# Patient Record
Sex: Male | Born: 1970 | Race: Black or African American | Hispanic: No | Marital: Married | Smoking: Never smoker
Health system: Southern US, Community
[De-identification: ages and names within clinical notes are randomized; demographics above are authoritative.]

---

## 2005-11-18 ENCOUNTER — Emergency Department: Payer: Self-pay | Admitting: Emergency Medicine

## 2006-08-15 IMAGING — CR RIGHT FOOT COMPLETE - 3+ VIEW
1 series · 3 of 3 positions shown · non-contrast
Comparison: none

REASON FOR EXAM: Injury
COMMENTS:  LMP: (Male)

PROCEDURE:     DXR - DXR FOOT RT COMPLETE W/OBLIQUES  - November 18, 2005  [DATE]
RESULT:          The bones of the foot appear to be adequately mineralized.
There is an Achilles and plantar calcaneal spur.  I see no acute fracture.

[Series 1: view not recorded · 0.17mm/px · 3 of 3 slices shown]
[im 1/3]
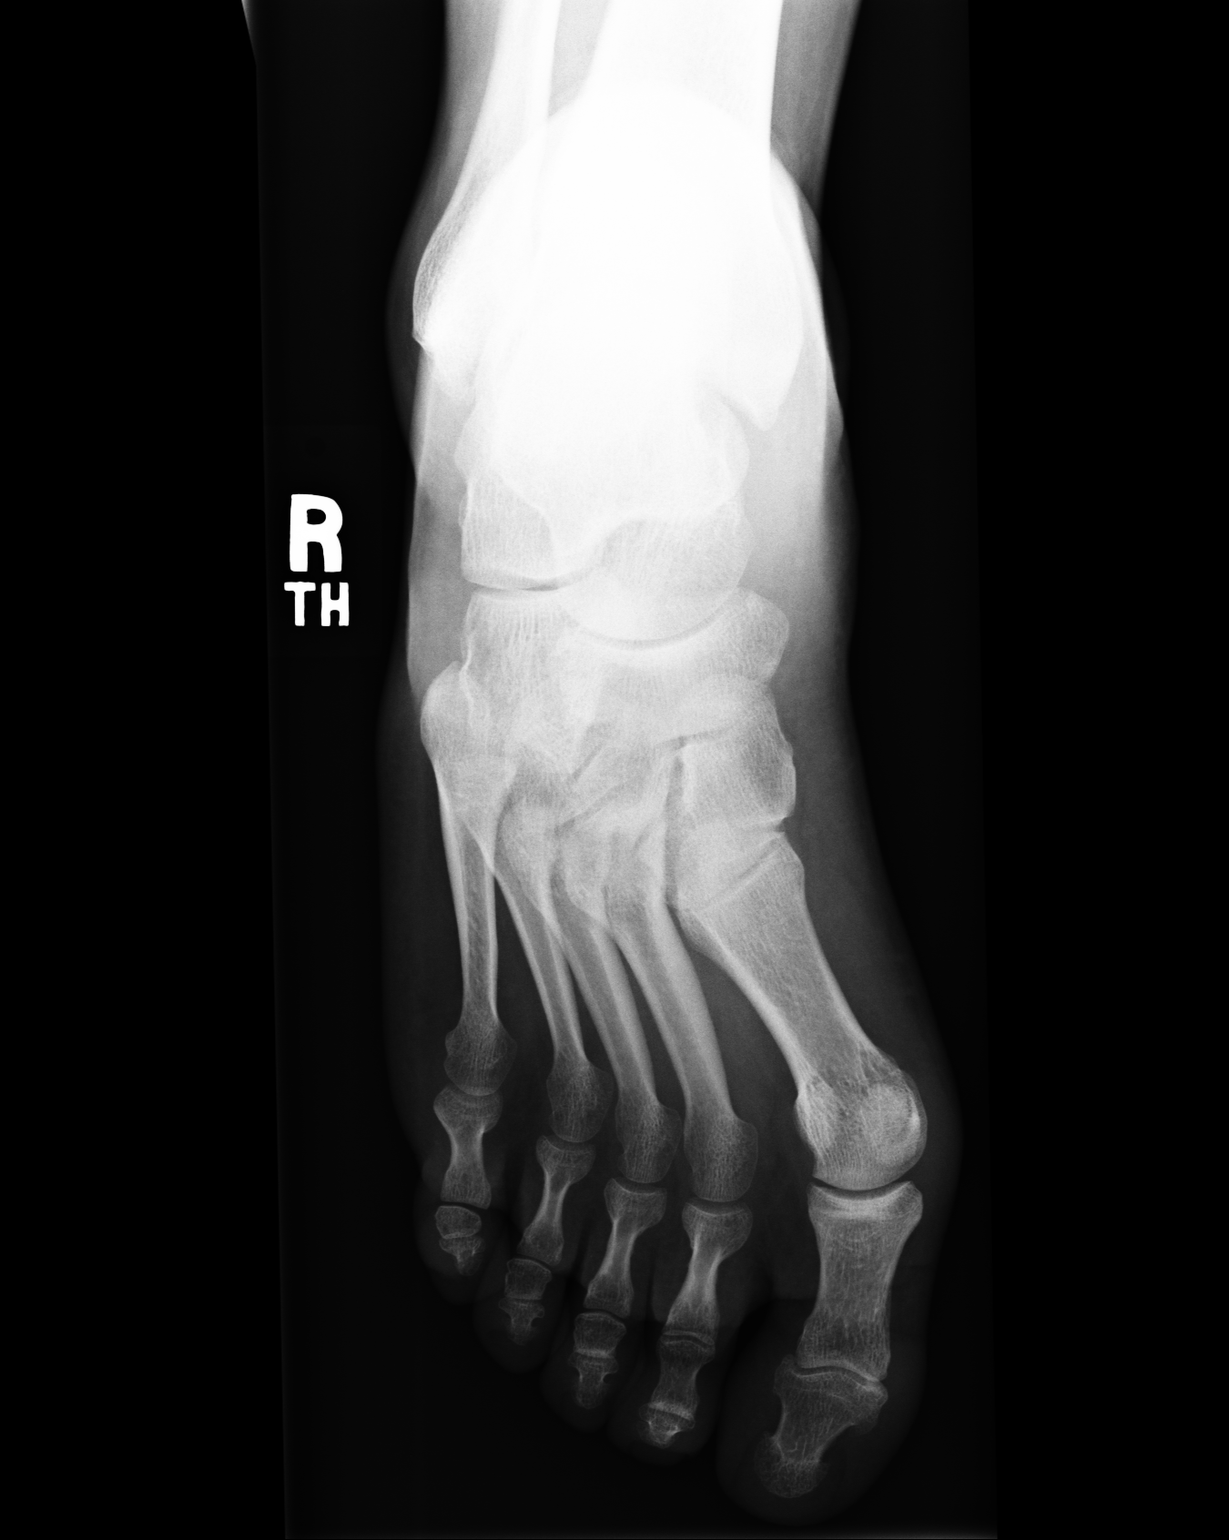
[im 2/3]
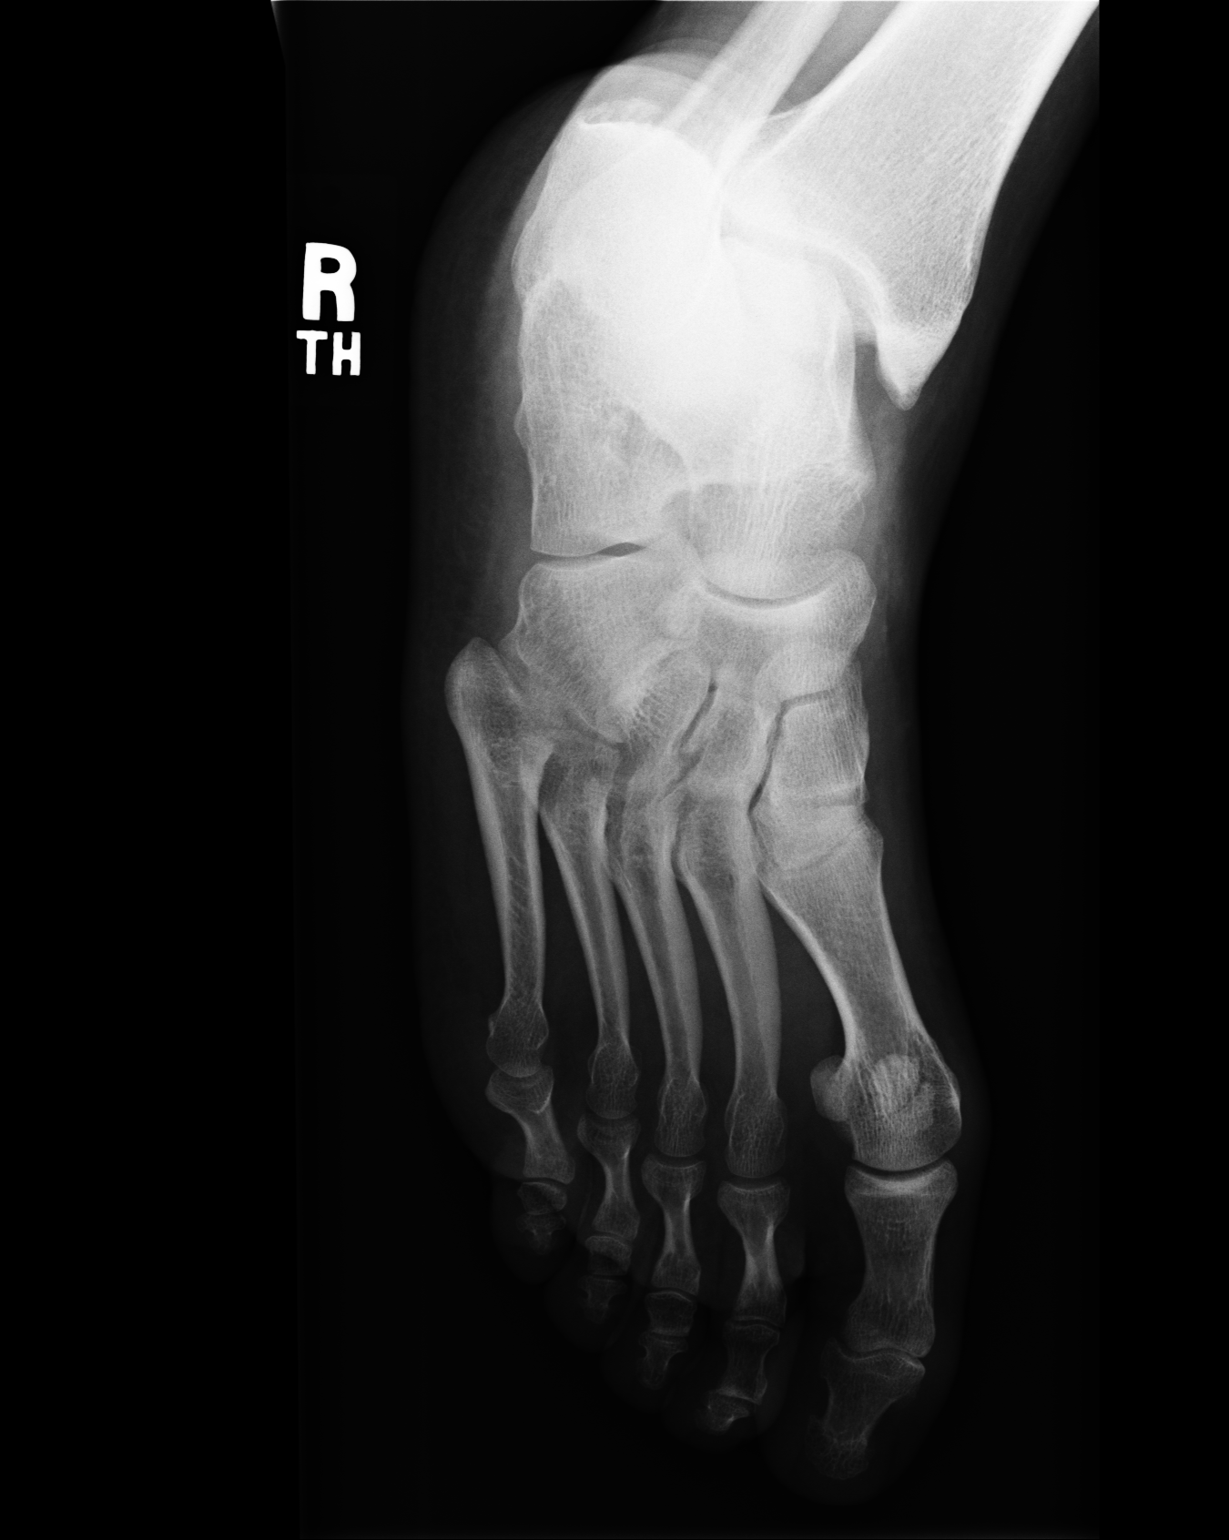
[im 3/3]
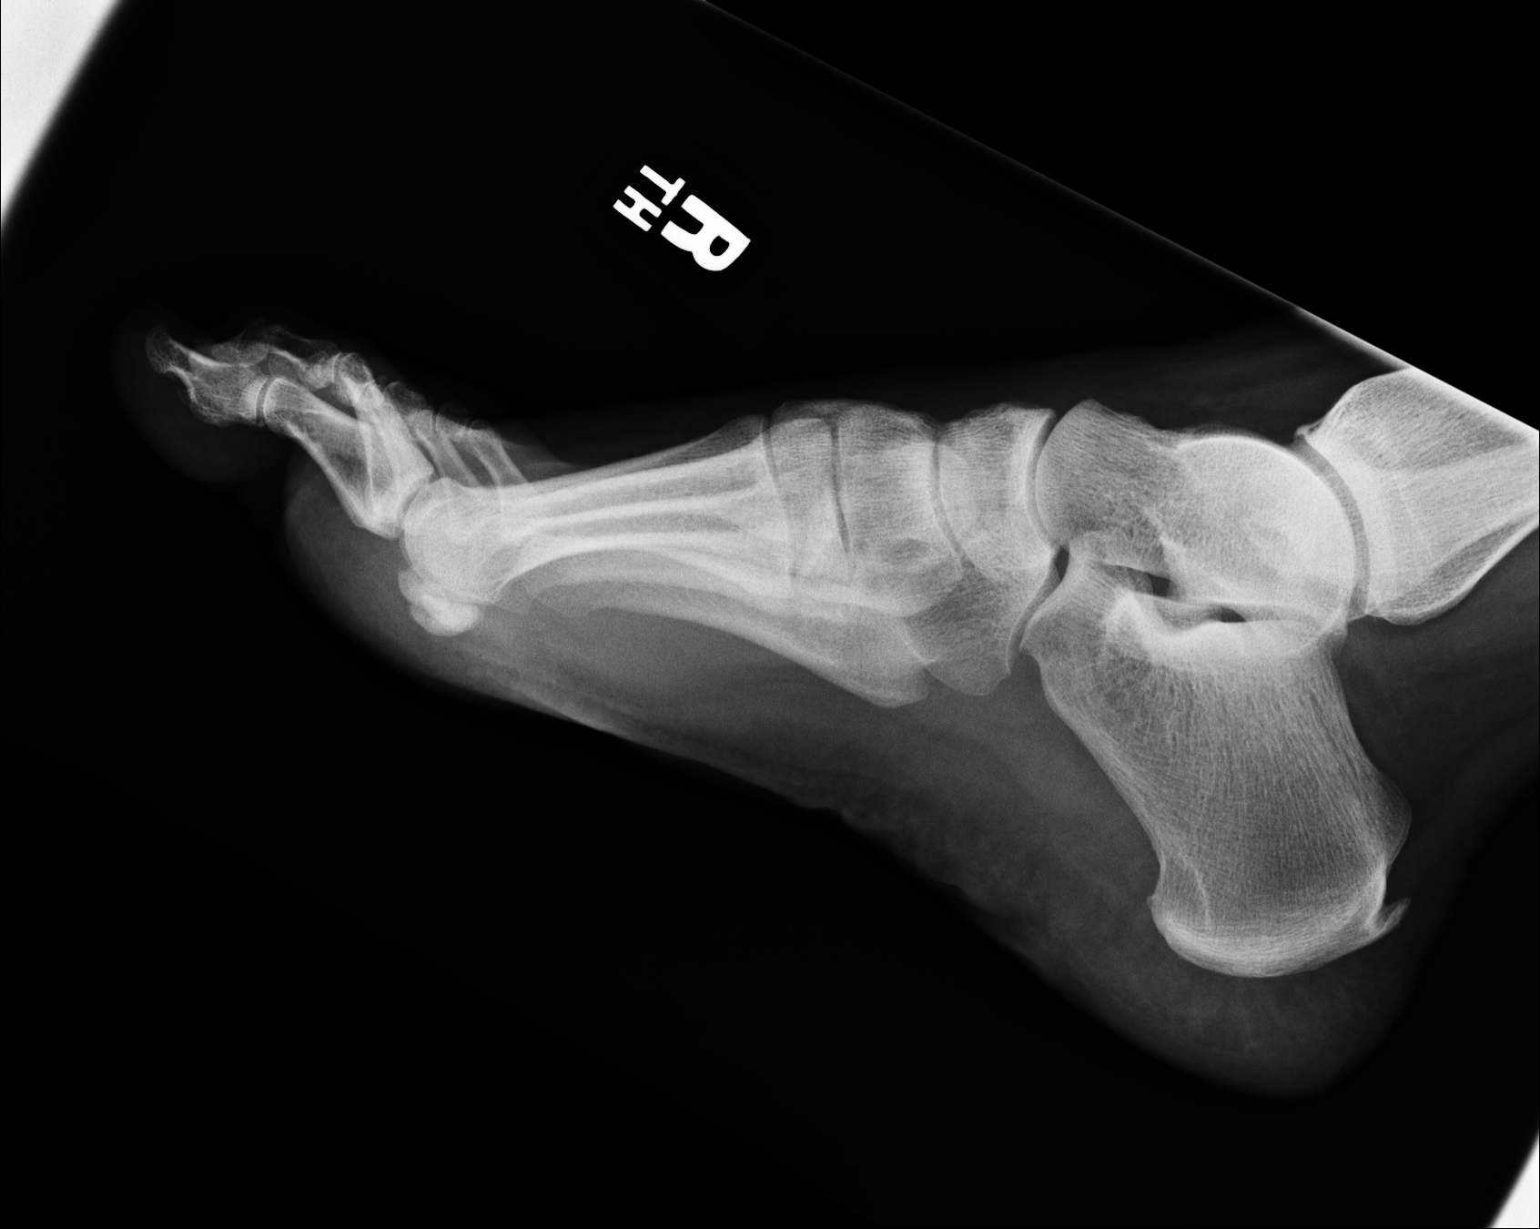

[3 of 3 positions shown; findings below may reference images not displayed]

IMPRESSION: I see no acute bony abnormality of the RIGHT foot.

## 2016-06-04 ENCOUNTER — Ambulatory Visit (INDEPENDENT_AMBULATORY_CARE_PROVIDER_SITE_OTHER): Payer: Managed Care, Other (non HMO) | Admitting: Podiatry

## 2016-06-04 ENCOUNTER — Encounter: Payer: Self-pay | Admitting: Podiatry

## 2016-06-04 ENCOUNTER — Ambulatory Visit (INDEPENDENT_AMBULATORY_CARE_PROVIDER_SITE_OTHER): Payer: Managed Care, Other (non HMO)

## 2016-06-04 VITALS — BP 120/73 | HR 64 | Resp 18

## 2016-06-04 DIAGNOSIS — R52 Pain, unspecified: Secondary | ICD-10-CM

## 2016-06-04 DIAGNOSIS — M779 Enthesopathy, unspecified: Secondary | ICD-10-CM | POA: Diagnosis not present

## 2016-06-04 MED ORDER — MELOXICAM 15 MG PO TABS: 15.0000 mg | ORAL_TABLET | Freq: Every day | ORAL | Status: AC

## 2016-06-04 NOTE — Progress Notes (Signed)
   Subjective:    Patient ID: Vincent Rose, male    DOB: 1971/09/04, 45 y.o.   MRN: [id number]  HPI  45 year old male presents the office today for concerns of pain to the office acid of his right foot which is been ongoing for several weeks was worsened on Friday. He does work on concrete for sending all day. He said no some discomfort after he was doing quite a walking. He has had no recent treatment for this. No swelling or redness. No numbness or tingling. Pain is not waking up at night. No other complaints at this time.  Review of Systems  All other systems reviewed and are negative.      Objective:   Physical Exam General: AAO x3, NAD  Dermatological: Skin is warm, dry and supple bilateral. Nails x 10 are well manicured; remaining integument appears unremarkable at this time. There are no open sores, no preulcerative lesions, no rash or signs of infection present.  Vascular: Dorsalis Pedis artery and Posterior Tibial artery pedal pulses are 2/4 bilateral with immedate capillary fill time. Pedal hair growth present.  There is no pain with calf compression, swelling, warmth, erythema.   Neruologic: Grossly intact via light touch bilateral. Vibratory intact via tuning fork bilateral. Protective threshold with Semmes Wienstein monofilament intact to all pedal sites bilateral. Patellar and Achilles deep tendon reflexes 2+ bilateral.   Musculoskeletal: There is tenderness palpation of the fifth metatarsal base and the right foot just proximal in the insertion of the peroneal tendon. There doesn't be localized edema to this area any erythema or increase in warmth. There is no pain to vibratory sensation. There is no areas of tenderness bilaterally. There doesn't be some mild pain with eversion of the foot. MMT 5/5. Range of motion intact. On gait evaluation does tend to roll his ankle he has a cavus type foot.  Gait: Unassisted, Nonantalgic.        Assessment & Plan:  Right foot pain,  likely insertional peroneal tendinitis -Treatment options discussed including all alternatives, risks, and complications -Etiology of symptoms were discussed -X-rays were obtained and reviewed with the patient. No evidence of acute fracture or stress fracture identified at this time. -Discussed steroid injection to the area of maximal tenderness knee wishes to proceed. Under sterile conditions a mixture of dexamethasone phosphate and local anesthetic was infiltrated to the area of maximal tenderness, complications. Care was taken not to inject directly into the tendon and this was placed around the area. Post injection care was discussed. -Plantar fascial brace dispensed and applied medial to lateral. -Prescribed mobic. Discussed side effects of the medication and directed to stop if any are to occur and call the office.  -Ice to the area -I discussed in shoe gear modifications and orthotics. Discussed CMO and he will let us know if he wants to pursue them.  -Follow-up as scheduled or sooner if any problems arise. In the meantime, encouraged to call the office with any questions, concerns, change in symptoms.   Ovid CurdMatthew Wagoner, DPM

## 2016-06-25 ENCOUNTER — Ambulatory Visit: Payer: Managed Care, Other (non HMO) | Admitting: Podiatry

## 2016-07-02 ENCOUNTER — Encounter: Payer: Self-pay | Admitting: Podiatry

## 2016-07-02 ENCOUNTER — Ambulatory Visit (INDEPENDENT_AMBULATORY_CARE_PROVIDER_SITE_OTHER): Payer: Managed Care, Other (non HMO) | Admitting: Podiatry

## 2016-07-02 DIAGNOSIS — M779 Enthesopathy, unspecified: Secondary | ICD-10-CM | POA: Diagnosis not present

## 2016-07-02 NOTE — Patient Instructions (Signed)
Peroneal Tendinitis With Rehab Tendonitis is inflammation of a tendon. Inflammation of the tendons on the back of the outer ankle (peroneal tendons) is known as peroneal tendonitis. The peroneal tendons are responsible for connecting the muscles that allow you to stand on your tiptoes to the bones of the ankle. For this reason, peroneal tendonitis often causes pain when trying to complete such motions. Peroneal tendonitis often involves a tear (strain) of the peroneal tendons. Strains are classified into three categories. Grade 1 strains cause pain, but the tendon is not lengthened. Grade 2 strains include a lengthened ligament, due to the ligament being stretched or partially ruptured. With grade 2 strains there is still function, although function may be decreased. Grade 3 strains involve a complete tear of the tendon or muscle, and function is usually impaired. SYMPTOMS   Pain, tenderness, swelling, warmth, or redness over the back of the outer side of the ankle, the outer part of the mid-foot, or the bottom of the arch.  Pain that gets worse with ankle motion (especially when pushing off or pushing down with the front of the foot), or when standing on the ball of the foot or pushing the foot outward.  Crackling sound (crepitation) when the tendon is moved or touched. CAUSES  Peroneal tendinitis occurs when injury to the peroneal tendons causes the body to respond with inflammation. Common causes of injury include:  An overuse injury, in which the groove behind the outer ankle (where the tendon is located) causes wear on the tendon.  A sudden stress placed on the tendon, such as from an increase in the intensity, frequency, or duration of training.  Direct hit (trauma) to the tendon.  Return to activity too soon after a previous ankle injury. RISK INCREASES WITH:  Sports that require sudden, repetitive pushing off of the foot, such as jumping or quick starts.  Kicking and running sports,  especially running down hills or long distances.  Poor strength and flexibility.  Previous injury to the foot, ankle, or leg. PREVENTION  Warm up and stretch properly before activity.  Allow for adequate recovery between workouts.  Maintain physical fitness:  Strength, flexibility, and endurance.  Cardiovascular fitness.  Complete rehabilitation after previous injury. PROGNOSIS  If treated properly, peroneal tendonitis usually heals within 6 weeks.  RELATED COMPLICATIONS  Longer healing time, if not properly treated or if not given enough time to heal.  Recurring symptoms if activity is resumed too soon, with overuse, or when using poor technique.  If untreated, tendinitis may result in tendon rupture, requiring surgery. TREATMENT  Treatment first involves the use of ice and medicine to reduce pain and inflammation. The use of strengthening and stretching exercises may help reduce pain with activity. These exercises may be performed at unsuccessful, surgery to remove the inflamed tendon lining (sheath) may be advised.  MEDICATION   If pain medicine is needed, nonsteroidal anti-inflammatory medicines (aspirin and ibuprofen), or other minor pain relievers (acetaminophen), are often advised.  Do not take pain medicine for 7 days before surgery.  Prescription pain relievers may be given, if your caregiver thinks they are needed. Use only as directed and only as much as you need. HEAT AND COLD  Cold treatment (icing) should be applied for 10 to 15 minutes every 2 to 3 hours for inflammation and pain, and immediately after activity that aggravates your symptoms. Use ice packs or an ice massage.  Heat treatment may be used before performing stretching and strengthening activities prescribed by your   caregiver, physical therapist, or athletic trainer. Use a heat pack or a warm water soak. SEEK MEDICAL CARE IF:  Symptoms get worse or do not improve in 2 to 4 weeks, despite  treatment.  New, unexplained symptoms develop. (Drugs used in treatment may produce side effects.) EXERCISES RANGE OF MOTION (ROM) AND STRETCHING EXERCISES - Peroneal Tendinitis These exercises may help you when beginning to rehabilitate your injury. Your symptoms may resolve with or without further involvement from your physician, physical therapist or athletic trainer. While completing these exercises, remember:   Restoring tissue flexibility helps normal motion to return to the joints. This allows healthier, less painful movement and activity.  An effective stretch should be held for at least 30 seconds.  A stretch should never be painful. You should only feel a gentle lengthening or release in the stretched tissue. RANGE OF MOTION - Ankle Eversion  Sit with your right / left ankle crossed over your opposite knee.  Grip your foot with your opposite hand, placing your thumb on the top of your foot and your fingers across the bottom of your foot.  Gently push your foot downward with a slight rotation, so your littlest toes rise slightly toward the ceiling.  You should feel a gentle stretch on the inside of your ankle. Hold the stretch for __________ seconds. Repeat __________ times. Complete this exercise __________ times per day.  RANGE OF MOTION - Ankle Inversion  Sit with your right / left ankle crossed over your opposite knee.  Grip your foot with your opposite hand, placing your thumb on the bottom of your foot and your fingers across the top of your foot.  Gently pull your foot so the smallest toe comes toward you and your thumb pushes the inside of the ball of your foot away from you.  You should feel a gentle stretch on the outside of your ankle. Hold the stretch for __________ seconds. Repeat __________ times. Complete this exercise __________ times per day.  RANGE OF MOTION - Ankle Plantar Flexion  Sit with your right / left leg crossed over your opposite knee.  Use  your opposite hand to pull the top of your foot and toes toward you.  You should feel a gentle stretch on the top of your foot and ankle. Hold this position for __________ seconds. Repeat __________ times. Complete __________ times per day.  STRETCH - Gastroc, Standing  Place your hands on a wall.  Extend your right / left leg behind you, keeping the front knee somewhat bent.  Slightly point your toes inward on your back foot.  Keeping your right / left heel on the floor and your knee straight, shift your weight toward the wall, not allowing your back to arch.  You should feel a gentle stretch in the calf. Hold this position for __________ seconds. Repeat __________ times. Complete this stretch __________ times per day. STRETCH - Soleus, Standing  Place your hands on a wall.  Extend your right / left leg behind you, keeping the other knee somewhat bent.  Slightly point your toes inward on your back foot.  Keep your heel on the floor, bend your back knee, and slightly shift your weight over the back leg so that you feel a gentle stretch deep in your back calf.  Hold this position for __________ seconds. Repeat __________ times. Complete this stretch __________ times per day. STRETCH - Gastrocsoleus, Standing Note: This exercise can place a lot of stress on your foot and ankle. Please   complete this exercise only if specifically instructed by your caregiver.   Place the ball of your right / left foot on a step, keeping your other foot firmly on the same step.  Hold on to the wall or a rail for balance.  Slowly lift your other foot, allowing your body weight to press your heel down over the edge of the step.  You should feel a stretch in your right / left calf.  Hold this position for __________ seconds.  Repeat this exercise with a slight bend in your knee. Repeat __________ times. Complete this stretch __________ times per day.  STRENGTHENING EXERCISES - Peroneal Tendinitis   These exercises may help you when beginning to rehabilitate your injury. They may resolve your symptoms with or without further involvement from your physician, physical therapist or athletic trainer. While completing these exercises, remember:   Muscles can gain both the endurance and the strength needed for everyday activities through controlled exercises.  Complete these exercises as instructed by your physician, physical therapist or athletic trainer. Increase the resistance and repetitions only as guided by your caregiver. STRENGTH - Dorsiflexors  Secure a rubber exercise band or tubing to a fixed object (table, pole) and loop the other end around your right / left foot.  Sit on the floor facing the fixed object. The band should be slightly tense when your foot is relaxed.  Slowly draw your foot back toward you, using your ankle and toes.  Hold this position for __________ seconds. Slowly release the tension in the band and return your foot to the starting position. Repeat __________ times. Complete this exercise __________ times per day.  STRENGTH - Towel Curls  Sit in a chair, on a non-carpeted surface.  Place your foot on a towel, keeping your heel on the floor.  Pull the towel toward your heel only by curling your toes. Keep your heel on the floor.  If instructed by your physician, physical therapist or athletic trainer, add weight to the end of the towel. Repeat __________ times. Complete this exercise __________ times per day. STRENGTH - Ankle Eversion   Secure one end of a rubber exercise band or tubing to a fixed object (table, pole). Loop the other end around your foot, just before your toes.  Place your fists between your knees. This will focus your strengthening at your ankle.  Drawing the band across your opposite foot, away from the pole, slowly, pull your little toe out and up. Make sure the band is positioned to resist the entire motion.  Hold this position for  __________ seconds.  Have your muscles resist the band, as it slowly pulls your foot back to the starting position. Repeat __________ times. Complete this exercise __________ times per day.    This information is not intended to replace advice given to you by your health care provider. Make sure you discuss any questions you have with your health care provider.   Document Released: 11/04/2005 Document Revised: 03/21/2015 Document Reviewed: 02/16/2009 [employer] Interactive Patient Education 2016 [employer]  

## 2016-07-02 NOTE — Progress Notes (Signed)
Subjective: 45 year old male presents the office today for follow-up evaluation of tendinitis. He states that overall he is much better. He states the injection the braces helped however the meloxicam did not do much. He has been stretching and icing as well. No new concerns today. Denies any systemic complaints such as fevers, chills, nausea, vomiting. No acute changes since last appointment, and no other complaints at this time.   Objective: AAO x3, NAD DP/PT pulses palpable bilaterally, CRT less than 3 seconds There is currently no tenderness to palpation along the plantar medial tubercle of the calcaneus at the insertion of plantar fascia on the right foot. There is no pain along the course of the plantar fascia within the arch of the foot. There is no tenderness on the fifth metatarsal base on the insertion. There is no pain on the course of the peroneal tendon. There is no edema, erythema, increase in warmth. Peroneal tendon appears intact. No other areas of tenderness at this time. No open lesions or pre-ulcerative lesions.  No pain with calf compression, swelling, warmth, erythema  Assessment:  Resolved right insertional peroneal tendinitis  Plan: -All treatment options discussed with the patient including all alternatives, risks, complications.  -As to hold off on further steroid injection. Continue with the brace for now. I asked discussed with him a more supportive inserts and shoes as well as shoe gear modifications. He'll start with an over-the-counter insert. If symptoms continue discussed with him custom insert. Continue with stretching, icing exercises daily. Ice to the area if needed. At this point follow-up as needed. The symptoms started to reoccur worse in the call the office immediately. He understood this and agreed to pinpoint.   Ovid CurdMatthew Wagoner, DPM
